# Patient Record
Sex: Female | Born: 1935 | Race: White | Hispanic: No | Smoking: Never smoker
Health system: Southern US, Community
[De-identification: ages and names within clinical notes are randomized; demographics above are authoritative.]

## PROBLEM LIST (undated history)

## (undated) DIAGNOSIS — I10 Essential (primary) hypertension: Secondary | ICD-10-CM

---

## 2004-10-09 ENCOUNTER — Ambulatory Visit: Payer: Self-pay | Admitting: Internal Medicine

## 2005-09-03 ENCOUNTER — Ambulatory Visit: Payer: Self-pay

## 2005-12-06 ENCOUNTER — Ambulatory Visit: Payer: Self-pay | Admitting: Internal Medicine

## 2006-12-09 ENCOUNTER — Ambulatory Visit: Payer: Self-pay | Admitting: Internal Medicine

## 2008-01-01 ENCOUNTER — Ambulatory Visit: Payer: Self-pay | Admitting: Internal Medicine

## 2008-07-23 ENCOUNTER — Other Ambulatory Visit: Payer: Self-pay

## 2008-07-23 ENCOUNTER — Inpatient Hospital Stay: Payer: Self-pay | Admitting: Unknown Physician Specialty

## 2008-07-28 ENCOUNTER — Encounter: Payer: Self-pay | Admitting: Internal Medicine

## 2008-07-29 ENCOUNTER — Encounter: Payer: Self-pay | Admitting: Internal Medicine

## 2008-11-18 ENCOUNTER — Ambulatory Visit: Payer: Self-pay | Admitting: Internal Medicine

## 2009-01-04 ENCOUNTER — Ambulatory Visit: Payer: Self-pay | Admitting: Internal Medicine

## 2010-01-05 ENCOUNTER — Ambulatory Visit: Payer: Self-pay | Admitting: Internal Medicine

## 2010-01-23 IMAGING — CR RIGHT HIP - COMPLETE 2+ VIEW
1 series · 2 of 2 positions shown · non-contrast
Comparison: none

REASON FOR EXAM: s/p fall with hip pain
COMMENTS:

PROCEDURE:     DXR - DXR HIP RIGHT COMPLETE  - July 23, 2008  [DATE]
RESULT:     Two views of the hip were obtained. There is a  minimally
displaced impaction type subcapital fracture of the RIGHT femoral neck. No
other fractures are seen.

[Series 1: view not recorded · 0.17mm/px · 2 of 2 slices shown]
[im 1/2]
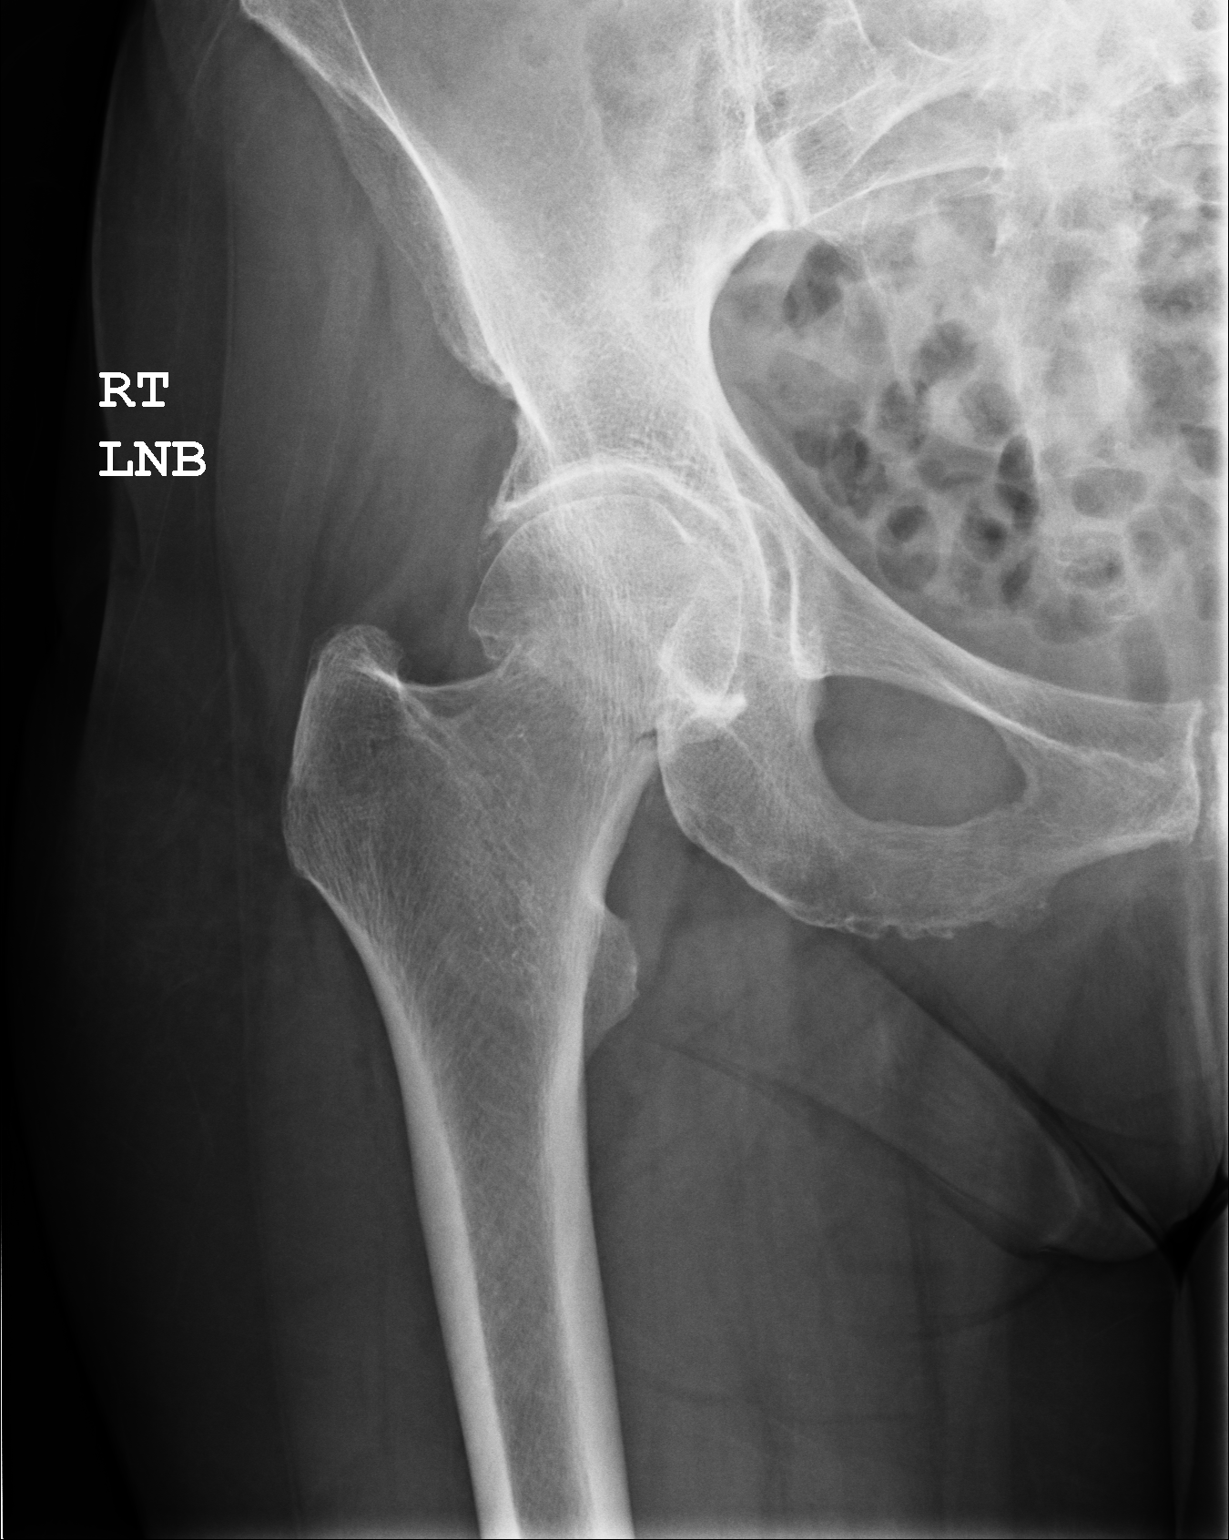
[im 2/2]
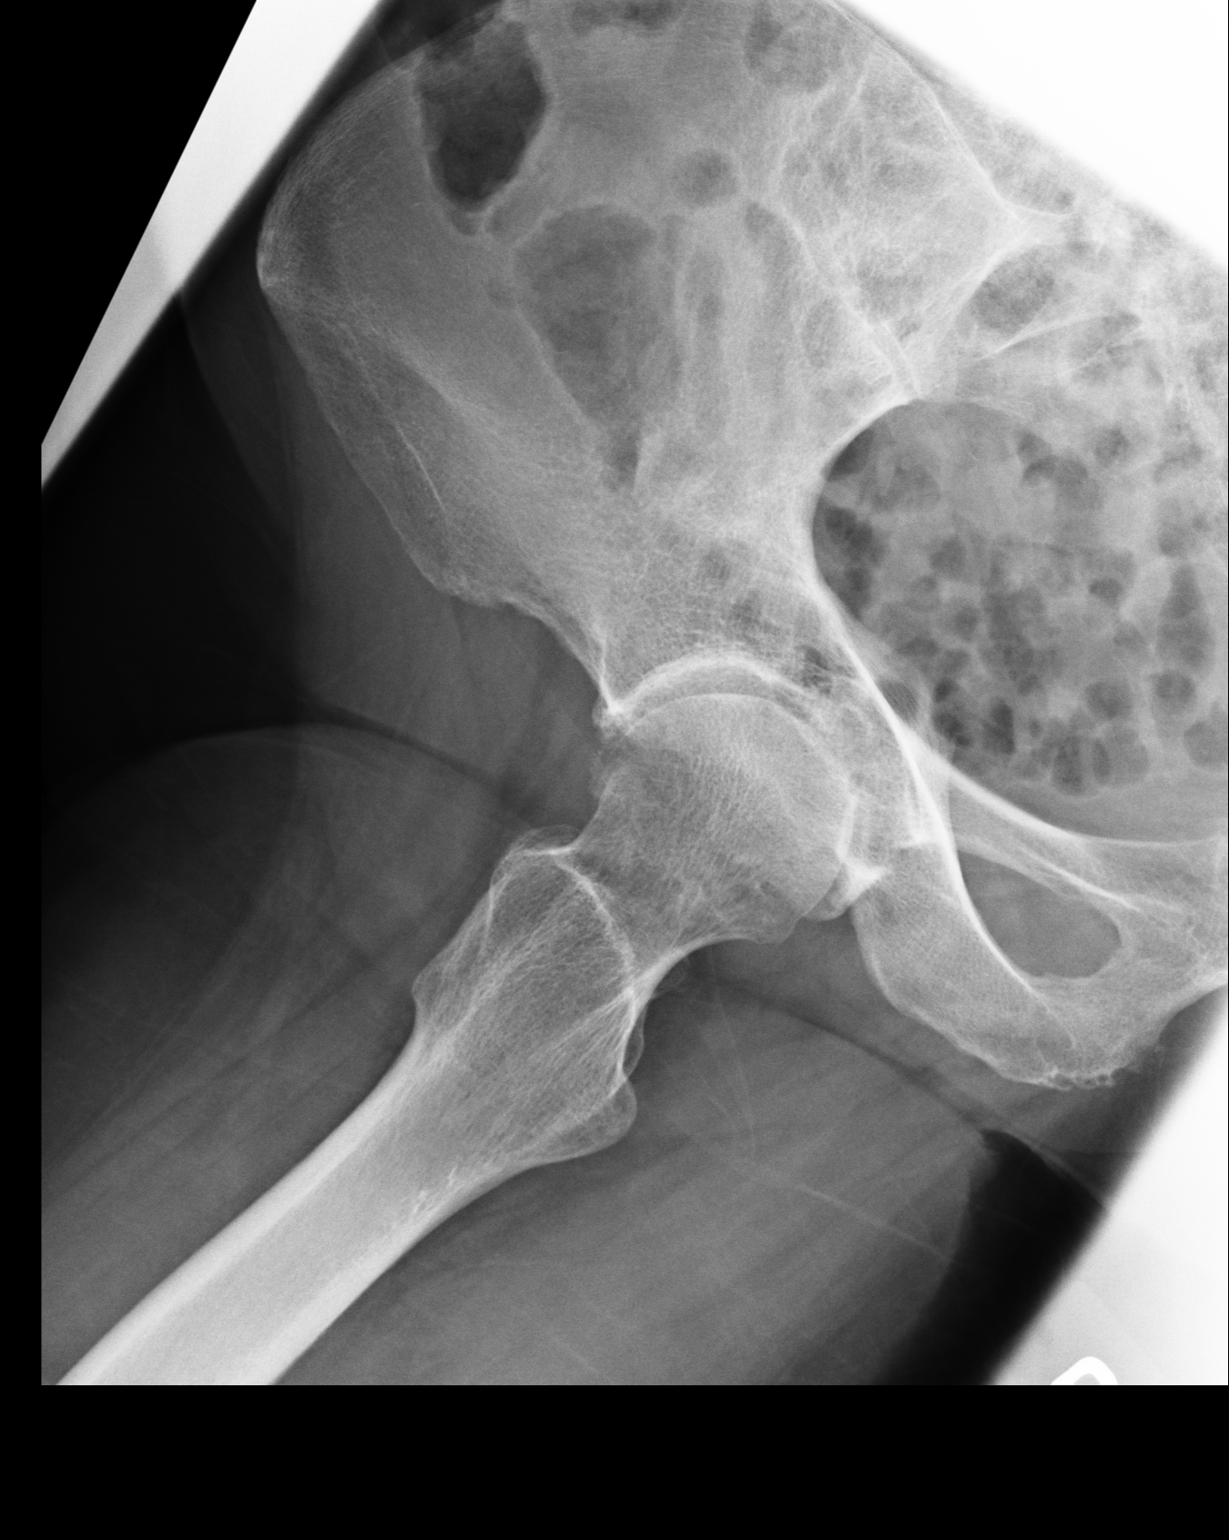

[2 of 2 positions shown; findings below may reference images not displayed]

IMPRESSION: Subcapital fracture of the RIGHT femoral neck, essentially
nondisplaced.

## 2010-01-23 IMAGING — CR DG CHEST 1V PORT
1 series · 1 of 1 positions shown · non-contrast
Comparison: none

REASON FOR EXAM: Pre-op
COMMENTS:

[view not recorded]
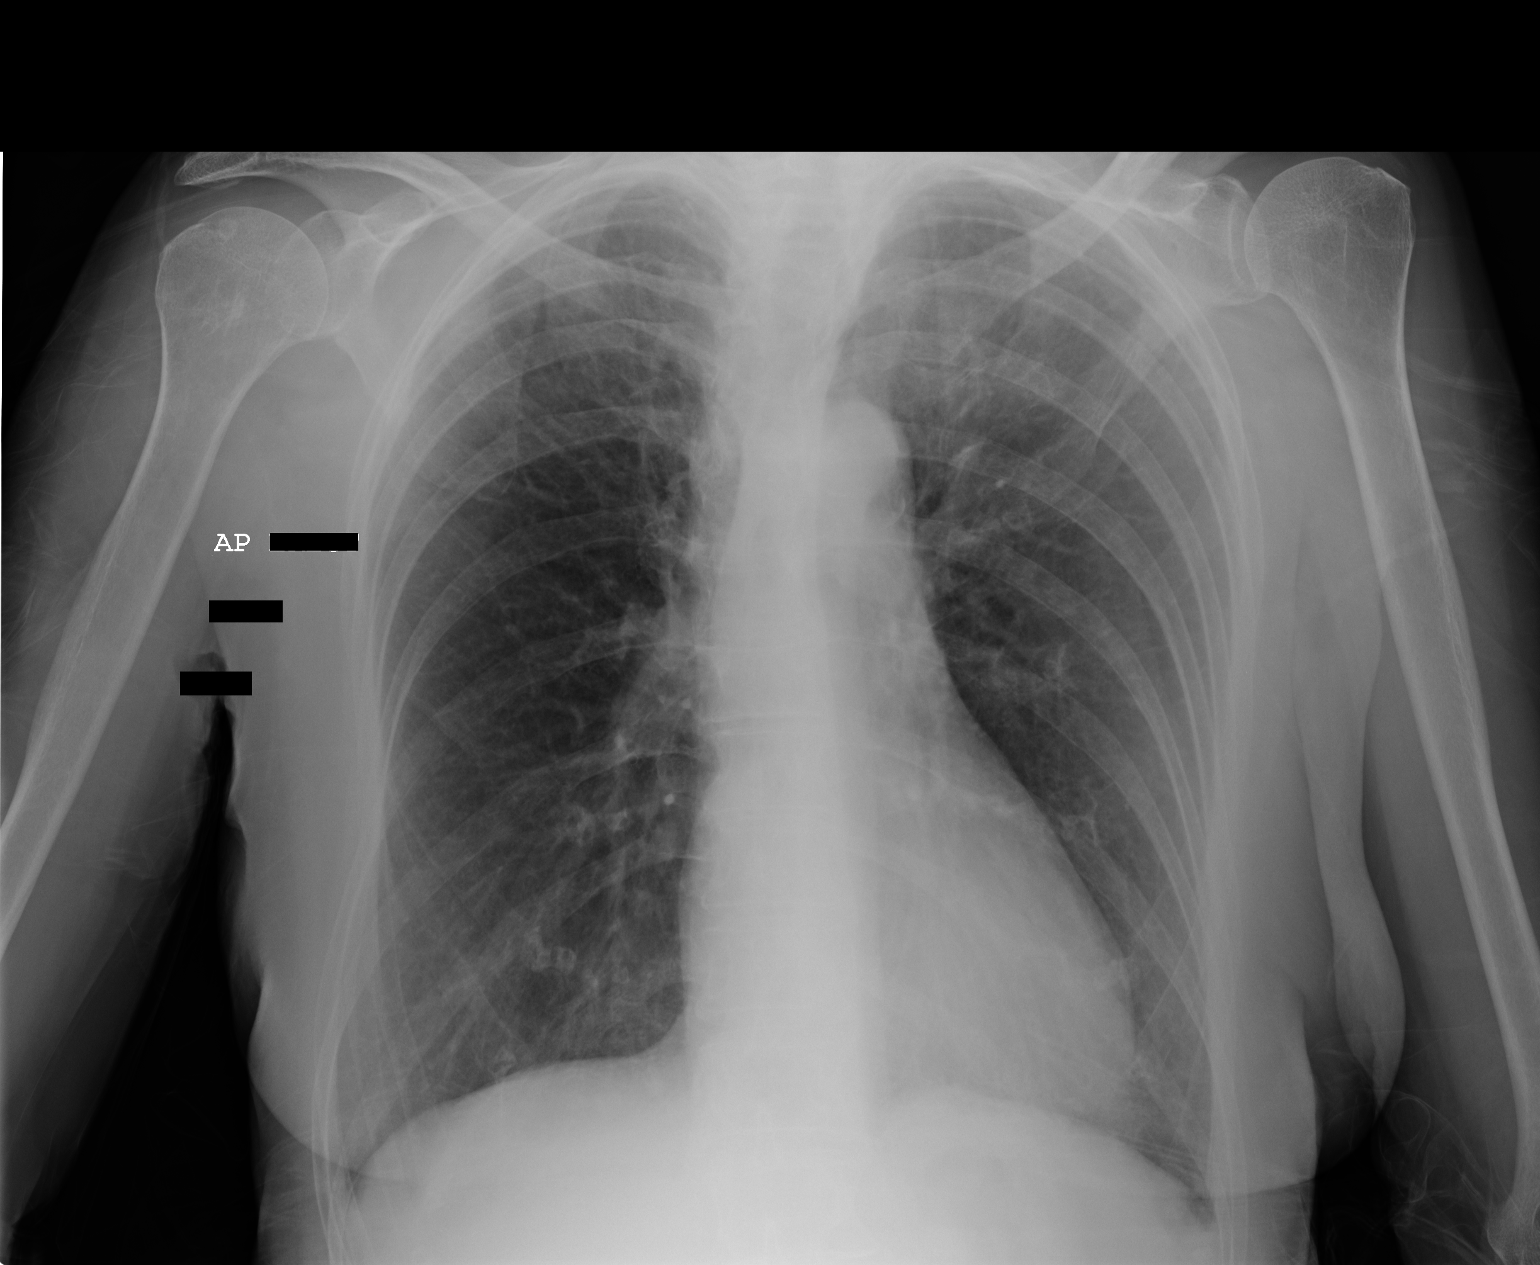

[1 of 1 positions shown; findings below may reference images not displayed]

PROCEDURE:     DXR - DXR PORTABLE CHEST SINGLE VIEW  - July 23, 2008  [DATE]

RESULT:     Frontal view of the chest is performed.

The lungs are hyperinflated. There is thickening of the interstitial
markings without evidence of peribronchial cuffing. No focal regions of
consolidation are appreciated or focal infiltrates. The cardiac silhouette
and visualized bony skeleton are unremarkable.
IMPRESSION: 1.     COPD with likely element of pulmonary fibrosis.
2.     No focal regions of consolidation are appreciated.

## 2010-01-25 IMAGING — CR RIGHT HAND - COMPLETE 3+ VIEW
1 series · 3 of 3 positions shown · non-contrast
Comparison: none

REASON FOR EXAM: Bruising and swelling of right middle digit, middle
finger
COMMENTS:

[Series 1: view not recorded · 0.17mm/px · 3 of 3 slices shown]
[im 1/3]
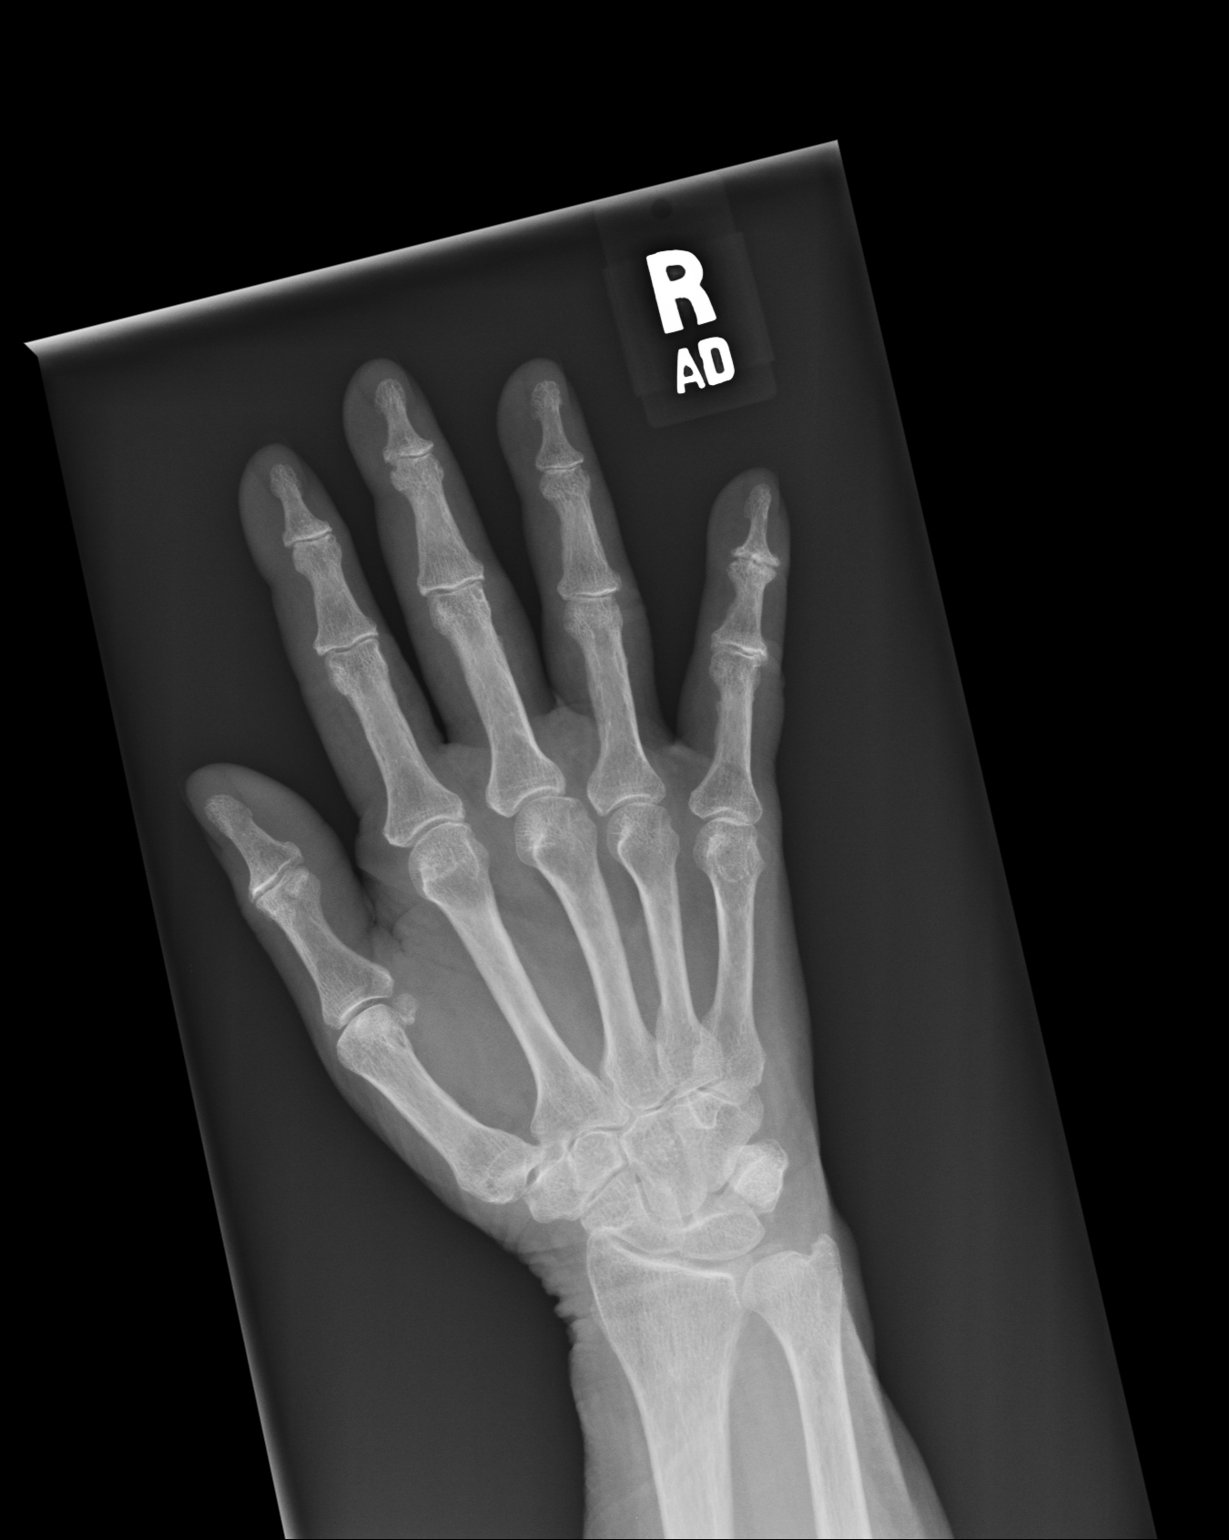
[im 2/3]
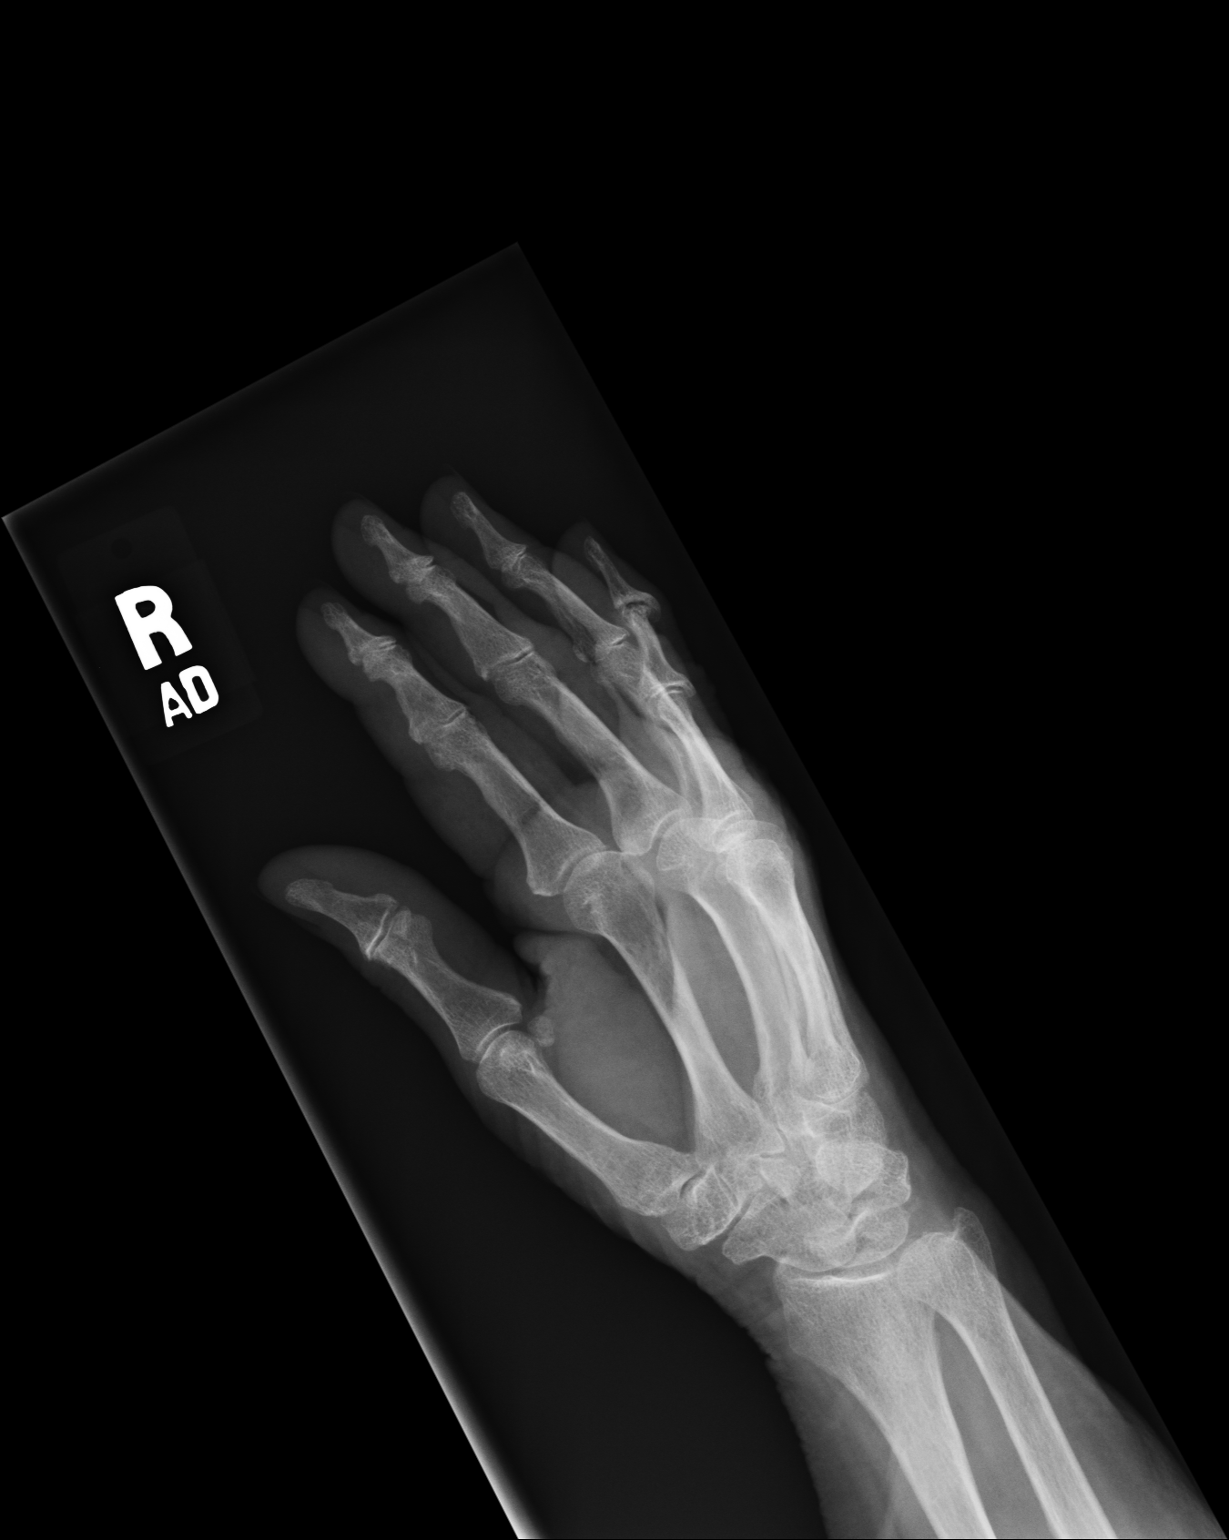
[im 3/3]
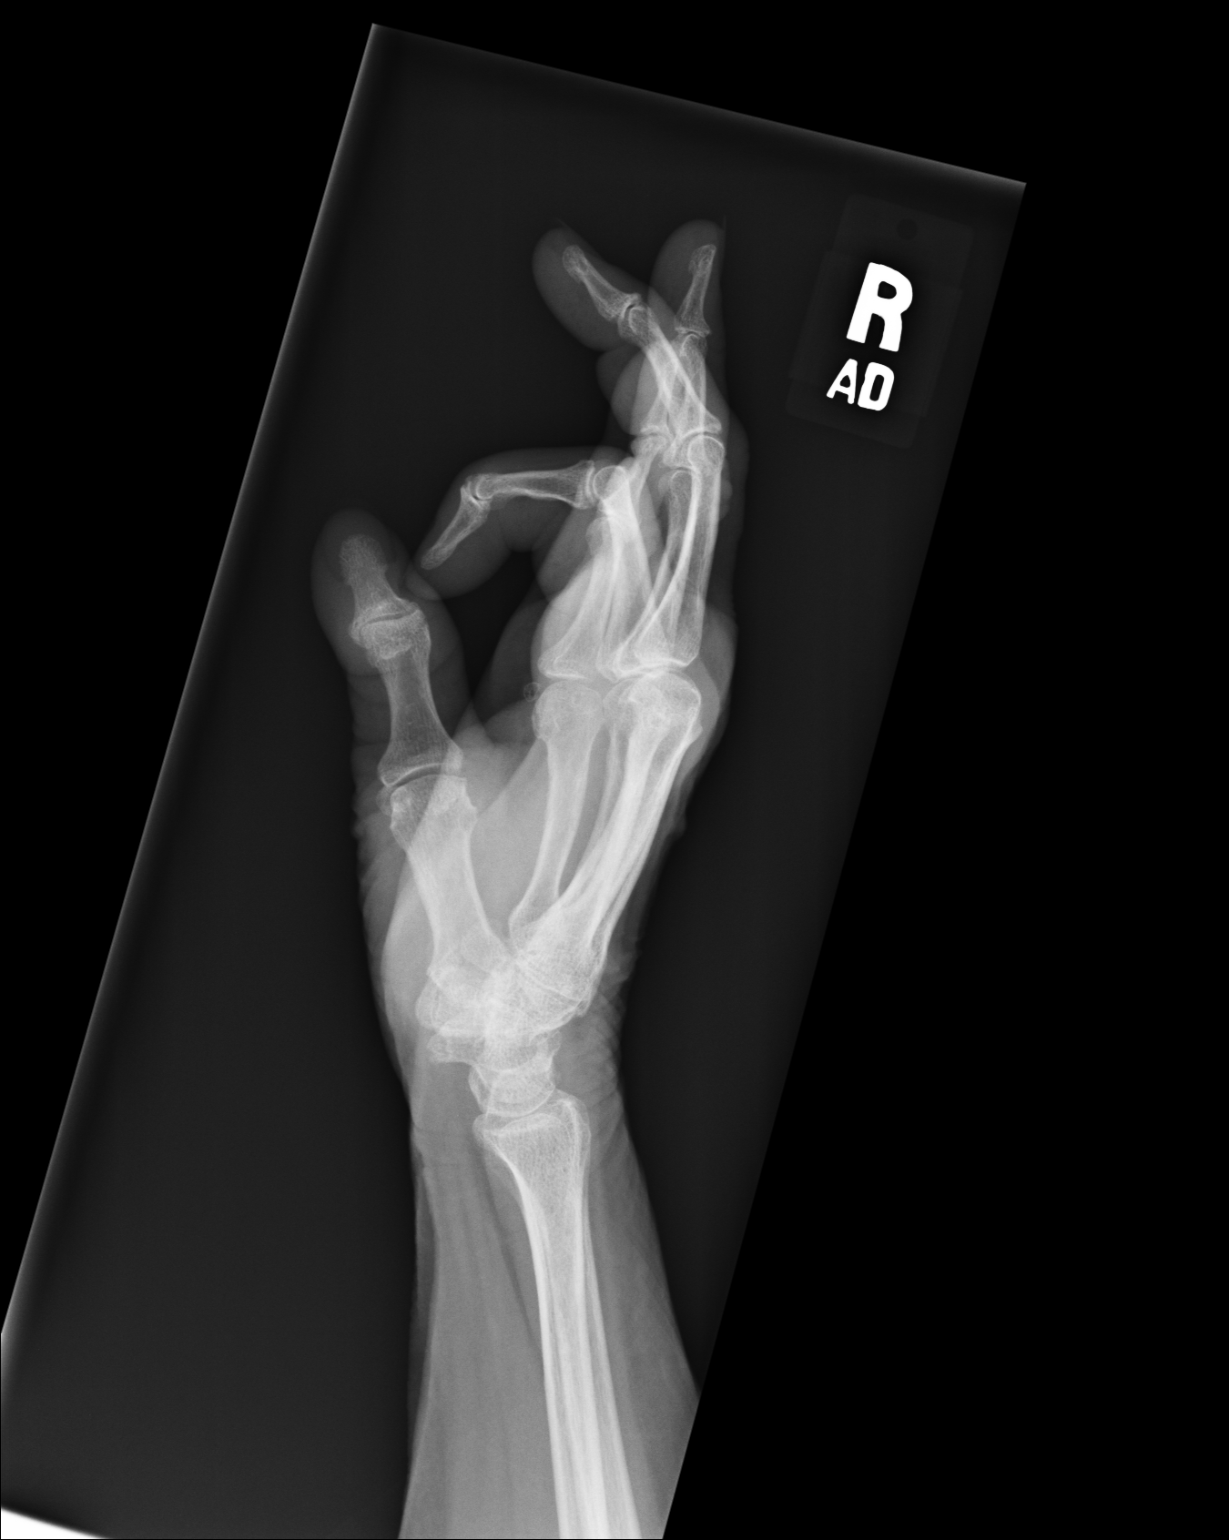

[3 of 3 positions shown; findings below may reference images not displayed]

PROCEDURE:     DXR - DXR HAND RT COMPLETE W/OBLIQUES  - July 25, 2008  [DATE]

RESULT:     There does not appear to be evidence of acute fracture or
dislocation. Degenerative changes are appreciated involving the proximal and
distal interphalangeal joints evident by subchondral sclerosis and
osteophytosis.
IMPRESSION: Degenerative changes without evidence of acute osseous
abnormalities.

## 2011-01-08 ENCOUNTER — Ambulatory Visit: Payer: Self-pay | Admitting: Internal Medicine

## 2015-12-31 ENCOUNTER — Emergency Department
Admission: EM | Admit: 2015-12-31 | Discharge: 2015-12-31 | Disposition: A | Discharge status: Home / Self Care | Payer: Medicare Other | Attending: Emergency Medicine | Admitting: Emergency Medicine

## 2015-12-31 DIAGNOSIS — I10 Essential (primary) hypertension: Secondary | ICD-10-CM | POA: Diagnosis present

## 2015-12-31 HISTORY — DX: Essential (primary) hypertension: I10

## 2015-12-31 LAB — CBC
HEMATOCRIT: 37.4 % (ref 35.0–47.0)
Hemoglobin: 12.4 g/dL (ref 12.0–16.0)
MCH: 33.2 pg (ref 26.0–34.0)
MCHC: 33.2 g/dL (ref 32.0–36.0)
MCV: 99.9 fL (ref 80.0–100.0)
Platelets: 170 10*3/uL (ref 150–440)
RBC: 3.74 MIL/uL — AB (ref 3.80–5.20)
RDW: 12.9 % (ref 11.5–14.5)
WBC: 5.6 10*3/uL (ref 3.6–11.0)

## 2015-12-31 LAB — BASIC METABOLIC PANEL
ANION GAP: 6 (ref 5–15)
BUN: 30 mg/dL — ABNORMAL HIGH (ref 6–20)
CO2: 29 mmol/L (ref 22–32)
Calcium: 10 mg/dL (ref 8.9–10.3)
Chloride: 102 mmol/L (ref 101–111)
Creatinine, Ser: 1.21 mg/dL — ABNORMAL HIGH (ref 0.44–1.00)
GFR calc Af Amer: 44 mL/min — ABNORMAL LOW (ref >60)
GFR, EST NON AFRICAN AMERICAN: 38 mL/min — AB (ref >60)
GLUCOSE: 108 mg/dL — AB (ref 65–99)
Potassium: 5.1 mmol/L (ref 3.5–5.1)
Sodium: 137 mmol/L (ref 135–145)

## 2015-12-31 LAB — TROPONIN I: Troponin I: <0.03 ng/mL (ref <0.031)

## 2015-12-31 MED ORDER — CLONIDINE HCL 0.1 MG PO TABS
0.1000 mg | ORAL_TABLET | Freq: Once | ORAL | Status: AC
  Administered 2015-12-31: 0.1 mg via ORAL
  Filled 2015-12-31: qty 1

## 2015-12-31 NOTE — ED Notes (Signed)
MD Paduchowski at bedside  

## 2015-12-31 NOTE — ED Provider Notes (Signed)
[facility] Emergency Department Provider Note  Time seen: 3:47 PM  I have reviewed the triage vital signs and the nursing notes.   HISTORY  Chief Complaint Hypertension    HPI Laura Bailey is a 82 y.o. female with a past medical history of hypertension presents the emergency department with hypertension. According to the patient's daughter with whom the patient lives, her husband was concerned that he may have taken too much of his blood pressure medication today so they checked his blood pressure. She states since she had the blood pressure cuff out she went ahead and checked the patient's blood pressure as well even though the patient was asymptomatic with no complaints. She noted a blood pressure 180 systolic so she brought the patient to the emergency department where they obtained a blood pressure 213 systolic. Patient denies any chest pain, trouble breathing, abdominal pain, nausea, vomiting, diarrhea. The patient denies any complaints at all. Patient states she believes she took her blood pressure medication this morning. The patient's daughter does state the patient has mild dementia.     Past Medical History  Diagnosis Date  . Hypertension     There are no active problems to display for this patient.   History reviewed. No pertinent past surgical history.  No current outpatient prescriptions on file.  Allergies Sulfa antibiotics  No family history on file.  Social History Social History  Substance Use Topics  . Smoking status: Never Smoker   . Smokeless tobacco: None  . Alcohol Use: No    Review of Systems Constitutional: Negative for fever Cardiovascular: Negative for chest pain. Respiratory: Negative for shortness of breath. Gastrointestinal: Negative for abdominal pain, vomiting and diarrhea. Musculoskeletal: Negative for back pain. Neurological: Negative for headache 10-point ROS otherwise  negative.  ____________________________________________   PHYSICAL EXAM:  VITAL SIGNS: ED Triage Vitals  Enc Vitals Group     BP 12/31/15 1313 213/62 mmHg     Pulse Rate 12/31/15 1313 61     Resp 12/31/15 1307 16     Temp 12/31/15 1313 97.9 F (36.6 C)     Temp Source 12/31/15 1307 Oral     SpO2 12/31/15 1313 100 %     Weight 12/31/15 1307 110 lb (49.896 kg)     Height 12/31/15 1307  (1.626 m)     Head Cir --      Peak Flow --      Pain Score --      Pain Loc --      Pain Edu? --      Excl. in GC? --     Constitutional: Alert and oriented. Well appearing and in no distress. Eyes: Normal exam ENT   Head: Normocephalic and atraumatic.   Mouth/Throat: Mucous membranes are moist. Cardiovascular: Normal rate, regular rhythm. No murmur Respiratory: Normal respiratory effort without tachypnea nor retractions. Breath sounds are clear Gastrointestinal: Soft and nontender. No distention. Musculoskeletal: Nontender with normal range of motion in all extremities. Equal 2+ radial pulses bilaterally. Neurologic:  Normal speech and language. No gross focal neurologic deficits. Ambulates without difficulty Skin:  Skin is warm, dry and intact.  Psychiatric: Mood and affect are normal. Speech and behavior are normal.  ____________________________________________    EKG  EKG reviewed and interpreted by myself shows normal sinus rhythm at 62 bpm, narrow QRS, normal axis, normal intervals, nonspecific but no concerning ST changes.  ____________________________________________     INITIAL IMPRESSION / ASSESSMENT AND PLAN / ED COURSE  Pertinent labs & imaging results that were available during my care of the patient were reviewed by me and considered in my medical decision making (see chart for details).  Patient presents the emergency department asymptomatic hypertension. Initial blood pressure 213/62. Patient's labs including troponin are within normal limits. EKG shows  no acute findings. Patient denies any symptoms at this time. We will repeat blood pressure in the emergency department.  Repeat blood pressure in the emergency department 234/81. Patient given 0.1 mg clonidine orally. Blood pressure currently 160 systolic after 0.1 mg clonidine. We will discharge patient home with PCP follow-up on Monday.    ____________________________________________   FINAL CLINICAL IMPRESSION(S) / ED DIAGNOSES  hypertension   Minna AntisKevin Chay Mazzoni, MD 12/31/15 1702

## 2015-12-31 NOTE — ED Notes (Signed)
Pt to Ed with daughter whom she lives with. Pt daughter states that pt BP was high last night, 170/74. PT did not have any sx prior to taking, family was taking BP at home and decided to take her. Pt takes Losartan for BP, has been taking for approx 3 years. Pt alert and oriented X4, active, cooperative, pt in NAD. RR even and unlabored, color WNL.

## 2015-12-31 NOTE — ED Notes (Signed)
Pt will be d/c once d/c papers are printed.  

## 2015-12-31 NOTE — Discharge Instructions (Signed)
Hypertension Hypertension, commonly called high blood pressure, is when the force of blood pumping through your arteries is too strong. Your arteries are the blood vessels that carry blood from your heart throughout your body. A blood pressure reading consists of a higher number over a lower number, such as 110/72. The higher number (systolic) is the pressure inside your arteries when your heart pumps. The lower number (diastolic) is the pressure inside your arteries when your heart relaxes. Ideally you want your blood pressure below 120/80. Hypertension forces your heart to work harder to pump blood. Your arteries may become narrow or stiff. Having untreated or uncontrolled hypertension can cause heart attack, stroke, kidney disease, and other problems. RISK FACTORS Some risk factors for high blood pressure are controllable. Others are not.  Risk factors you cannot control include:   Race. You may be at higher risk if you are African American.  Age. Risk increases with age.  Gender. Men are at higher risk than women before age 45 years. After age 65, women are at higher risk than men. Risk factors you can control include:  Not getting enough exercise or physical activity.  Being overweight.  Getting too much fat, sugar, calories, or salt in your diet.  Drinking too much alcohol. SIGNS AND SYMPTOMS Hypertension does not usually cause signs or symptoms. Extremely high blood pressure (hypertensive crisis) may cause headache, anxiety, shortness of breath, and nosebleed. DIAGNOSIS To check if you have hypertension, your health care provider will measure your blood pressure while you are seated, with your arm held at the level of your heart. It should be measured at least twice using the same arm. Certain conditions can cause a difference in blood pressure between your right and left arms. A blood pressure reading that is higher than normal on one occasion does not mean that you need treatment. If  it is not clear whether you have high blood pressure, you may be asked to return on a different day to have your blood pressure checked again. Or, you may be asked to monitor your blood pressure at home for 1 or more weeks. TREATMENT Treating high blood pressure includes making lifestyle changes and possibly taking medicine. Living a healthy lifestyle can help lower high blood pressure. You may need to change some of your habits. Lifestyle changes may include:  Following the DASH diet. This diet is high in fruits, vegetables, and whole grains. It is low in salt, red meat, and added sugars.  Keep your sodium intake below 2,300 mg per day.  Getting at least 30-45 minutes of aerobic exercise at least 4 times per week.  Losing weight if necessary.  Not smoking.  Limiting alcoholic beverages.  Learning ways to reduce stress. Your health care provider may prescribe medicine if lifestyle changes are not enough to get your blood pressure under control, and if one of the following is true:  You are 18-59 years of age and your systolic blood pressure is above 140.  You are 60 years of age or older, and your systolic blood pressure is above 150.  Your diastolic blood pressure is above 90.  You have diabetes, and your systolic blood pressure is over 140 or your diastolic blood pressure is over 90.  You have kidney disease and your blood pressure is above 140/90.  You have heart disease and your blood pressure is above 140/90. Your personal target blood pressure may vary depending on your medical conditions, your age, and other factors. HOME CARE INSTRUCTIONS    Have your blood pressure rechecked as directed by your health care provider.   Take medicines only as directed by your health care provider. Follow the directions carefully. Blood pressure medicines must be taken as prescribed. The medicine does not work as well when you skip doses. Skipping doses also puts you at risk for  problems.  Do not smoke.   Monitor your blood pressure at home as directed by your health care provider. SEEK MEDICAL CARE IF:   You think you are having a reaction to medicines taken.  You have recurrent headaches or feel dizzy.  You have swelling in your ankles.  You have trouble with your vision. SEEK IMMEDIATE MEDICAL CARE IF:  You develop a severe headache or confusion.  You have unusual weakness, numbness, or feel faint.  You have severe chest or abdominal pain.  You vomit repeatedly.  You have trouble breathing. MAKE SURE YOU:   Understand these instructions.  Will watch your condition.  Will get help right away if you are not doing well or get worse.   This information is not intended to replace advice given to you by your health care provider. Make sure you discuss any questions you have with your health care provider.   Document Released: 10/15/2005 Document Revised: 03/01/2015 Document Reviewed: 08/07/2013 Elsevier Interactive Patient Education 2016 [employer]  

## 2016-05-25 ENCOUNTER — Emergency Department: Payer: Medicare Other

## 2016-05-25 ENCOUNTER — Emergency Department
Admission: EM | Admit: 2016-05-25 | Discharge: 2016-05-25 | Disposition: A | Discharge status: Home / Self Care | Payer: Medicare Other | Attending: Emergency Medicine | Admitting: Emergency Medicine

## 2016-05-25 DIAGNOSIS — I10 Essential (primary) hypertension: Secondary | ICD-10-CM | POA: Insufficient documentation

## 2016-05-25 DIAGNOSIS — R42 Dizziness and giddiness: Secondary | ICD-10-CM | POA: Diagnosis not present

## 2016-05-25 LAB — COMPREHENSIVE METABOLIC PANEL
ALBUMIN: 4 g/dL (ref 3.5–5.0)
ALK PHOS: 50 U/L (ref 38–126)
ALT: 11 U/L — AB (ref 14–54)
AST: 23 U/L (ref 15–41)
Anion gap: 7 (ref 5–15)
BUN: 32 mg/dL — ABNORMAL HIGH (ref 6–20)
CALCIUM: 9.7 mg/dL (ref 8.9–10.3)
CHLORIDE: 102 mmol/L (ref 101–111)
CO2: 26 mmol/L (ref 22–32)
CREATININE: 1.18 mg/dL — AB (ref 0.44–1.00)
GFR calc Af Amer: 45 mL/min — ABNORMAL LOW (ref >60)
GFR calc non Af Amer: 39 mL/min — ABNORMAL LOW (ref >60)
GLUCOSE: 120 mg/dL — AB (ref 65–99)
Potassium: 3.9 mmol/L (ref 3.5–5.1)
SODIUM: 135 mmol/L (ref 135–145)
Total Bilirubin: 0.7 mg/dL (ref 0.3–1.2)
Total Protein: 7 g/dL (ref 6.5–8.1)

## 2016-05-25 LAB — CBC WITH DIFFERENTIAL/PLATELET
BASOS ABS: 0.1 10*3/uL (ref 0–0.1)
BASOS PCT: 1 %
EOS ABS: 0.1 10*3/uL (ref 0–0.7)
Eosinophils Relative: 1 %
HCT: 34.6 % — ABNORMAL LOW (ref 35.0–47.0)
HEMOGLOBIN: 11.9 g/dL — AB (ref 12.0–16.0)
Lymphocytes Relative: 29 %
Lymphs Abs: 3 10*3/uL (ref 1.0–3.6)
MCH: 33.2 pg (ref 26.0–34.0)
MCHC: 34.3 g/dL (ref 32.0–36.0)
MCV: 96.9 fL (ref 80.0–100.0)
Monocytes Absolute: 1 10*3/uL — ABNORMAL HIGH (ref 0.2–0.9)
Monocytes Relative: 10 %
NEUTROS PCT: 59 %
Neutro Abs: 6.3 10*3/uL (ref 1.4–6.5)
Platelets: 151 10*3/uL (ref 150–440)
RBC: 3.57 MIL/uL — AB (ref 3.80–5.20)
RDW: 13.1 % (ref 11.5–14.5)
WBC: 10.5 10*3/uL (ref 3.6–11.0)

## 2016-05-25 LAB — URINALYSIS COMPLETE WITH MICROSCOPIC (ARMC ONLY)
BACTERIA UA: NONE SEEN
BILIRUBIN URINE: NEGATIVE
GLUCOSE, UA: NEGATIVE mg/dL
Ketones, ur: NEGATIVE mg/dL
Leukocytes, UA: NEGATIVE
NITRITE: NEGATIVE
Protein, ur: NEGATIVE mg/dL
SQUAMOUS EPITHELIAL / LPF: NONE SEEN
Specific Gravity, Urine: 1.013 (ref 1.005–1.030)
pH: 5 (ref 5.0–8.0)

## 2016-05-25 LAB — LIPASE, BLOOD: Lipase: 44 U/L (ref 11–51)

## 2016-05-25 LAB — TROPONIN I: Troponin I: <0.03 ng/mL (ref <0.03)

## 2016-05-25 MED ORDER — MECLIZINE HCL 12.5 MG PO TABS: 12.5000 mg | ORAL_TABLET | Freq: Three times a day (TID) | ORAL | 0 refills | Status: AC | PRN

## 2016-05-25 MED ORDER — MECLIZINE HCL 25 MG PO TABS
12.5000 mg | ORAL_TABLET | Freq: Once | ORAL | Status: AC
  Administered 2016-05-25: 12.5 mg via ORAL
  Filled 2016-05-25: qty 1

## 2016-05-25 MED ORDER — SODIUM CHLORIDE 0.9 % IV BOLUS (SEPSIS)
500.0000 mL | Freq: Once | INTRAVENOUS | Status: AC
  Administered 2016-05-25: 500 mL via INTRAVENOUS

## 2016-05-25 NOTE — ED Notes (Signed)
Headache/ dizziness since fall on Sunday

## 2016-05-25 NOTE — ED Notes (Signed)
MD Yao at bedside 

## 2016-05-25 NOTE — ED Notes (Signed)
Reviewed d/c instructions, follow-up care, and prescription with pt. Pt verbalized understanding 

## 2016-05-25 NOTE — ED Notes (Signed)
Patient transported to MRI 

## 2016-05-25 NOTE — ED Provider Notes (Signed)
[facility]-EMERGENCY DEPARTMENT Provider Note   CSN: [id number] Arrival date & time: 05/25/16  2248  First Provider Contact:  First MD Initiated Contact with Patient 05/25/16 1120        History   Chief Complaint Chief Complaint  Patient presents with  . Dizziness    HPI Laura Bailey is a 82 y.o. female.  The history is provided by the patient and a relative.  Laura Bailey is a 82 y.o. female hx of HTN, Present with dizziness. Dizziness for the last week or so. Like the room is spinning. She did have some trouble walking and did hit her head several days ago.  Since then she has persistent dizziness. She states that the room occasionally and spinning. He has some headaches yesterday and have 1 episode of vomiting yesterday. Denies any chest pain or shortness of breath.    Past Medical History:  Diagnosis Date  . Hypertension     There are no active problems to display for this patient.   No past surgical history on file.  OB History    No data available       Home Medications    Prior to Admission medications   Not on File    Family History No family history on file.  Social History Social History  Substance Use Topics  . Smoking status: Never Smoker  . Smokeless tobacco: Not on file  . Alcohol use No     Allergies   Sulfa antibiotics   Review of Systems Review of Systems  Neurological: Positive for dizziness.  All other systems reviewed and are negative.    Physical Exam Updated Vital Signs BP 139/66 (BP Location: Right Arm)   Pulse 65   Temp 98.5 F (36.9 C) (Oral)   Resp 18   Ht 5\' 5"  (1.651 m)   Wt 105 lb (47.6 kg)   SpO2 99%   BMI 17.47 kg/m   Physical Exam  Constitutional: She is oriented to person, place, and time.  Chronically ill   HENT:  Head: Normocephalic.  Mouth/Throat: Oropharynx is clear and moist.  Eyes: EOM are normal. Pupils are equal, round, and reactive to light.  ? Mild R sided nystagmus, no rotatory or  vertical nystagmus   Neck: Normal range of motion. Neck supple.  Cardiovascular: Normal rate and regular rhythm.   Pulmonary/Chest: Effort normal and breath sounds normal.  Abdominal: Soft. Bowel sounds are normal.  Musculoskeletal: Normal range of motion.  Neurological: She is alert and oriented to person, place, and time.  CN 2-12 intact. ? Mild dysmetria on L side. Nl strength throughout   Skin: Skin is warm.  Psychiatric: She has a normal mood and affect.  Nursing note and vitals reviewed.    ED Treatments / Results  Labs (all labs ordered are listed, but only abnormal results are displayed) Labs Reviewed  CBC WITH DIFFERENTIAL/PLATELET - Abnormal; Notable for the following:       Result Value   RBC 3.57 (*)    Hemoglobin 11.9 (*)    HCT 34.6 (*)    Monocytes Absolute 1.0 (*)    All other components within normal limits  COMPREHENSIVE METABOLIC PANEL - Abnormal; Notable for the following:    Glucose, Bld 120 (*)    BUN 32 (*)    Creatinine, Ser 1.18 (*)    ALT 11 (*)    GFR calc non Af Amer 39 (*)    GFR calc Af Amer 45 (*)    All  other components within normal limits  URINALYSIS COMPLETEWITH MICROSCOPIC ([facility] ONLY) - Abnormal; Notable for the following:    Color, Urine YELLOW (*)    APPearance CLEAR (*)    Hgb urine dipstick 2+ (*)    All other components within normal limits  LIPASE, BLOOD  TROPONIN I    EKG  EKG Interpretation None      ED ECG REPORT I, Luchiano Viscomi, the attending physician, personally viewed and interpreted this ECG.   Date: 05/25/2016  EKG Time: 9:23 am  Rate: 68  Rhythm: normal EKG, normal sinus rhythm  Axis: normal  Intervals:none  ST&T Change: nonspecific     Radiology Ct Head Wo Contrast  Result Date: 05/25/2016 CLINICAL DATA:  Pain following fall EXAM: CT HEAD WITHOUT CONTRAST CT CERVICAL SPINE WITHOUT CONTRAST TECHNIQUE: Multidetector CT imaging of the head and cervical spine was performed following the standard protocol  without intravenous contrast. Multiplanar CT image reconstructions of the cervical spine were also generated. COMPARISON:  None. FINDINGS: CT HEAD FINDINGS There is moderate diffuse atrophy. There is no intracranial mass, hemorrhage, extra-axial fluid collection, or midline shift. There is patchy small vessel disease throughout the centra semiovale bilaterally. Elsewhere gray-white compartments appear unremarkable. No acute infarct is evident. There are foci of calcification in each distal vertebral artery as well as in the carotid siphon regions bilaterally. No hyperdense vessels are evident. The bony calvarium appears intact. The mastoid air cells are clear. Paranasal sinuses are clear. There is mild leftward deviation of the nasal septum. No intraorbital lesions are evident. CT CERVICAL SPINE FINDINGS There is no demonstrable fracture or spondylolisthesis. Prevertebral soft tissues and predental space regions are normal. There is moderately severe disc space narrowing at C6-7. There is moderate disc space narrowing at C5-6. There is facet hypertrophy at multiple levels in the cervical region. There is moderate pannus surrounding the odontoid without appreciable impression on the craniocervical junction. There is carotid artery calcification bilaterally. IMPRESSION: CT head: Atrophy with periventricular small vessel disease. No intracranial mass, hemorrhage, or extra-axial fluid collection. No acute infarct. There are multiple foci of arterial vascular calcification. There is leftward deviation of the nasal septum. CT cervical spine: No fracture or spondylolisthesis. Osteoarthritic changes noted at multiple levels. There are foci of carotid artery calcification bilaterally. Electronically Signed   By: Bretta Bang III M.D.   On: 05/25/2016 10:32  Ct Cervical Spine Wo Contrast  Result Date: 05/25/2016 CLINICAL DATA:  Pain following fall EXAM: CT HEAD WITHOUT CONTRAST CT CERVICAL SPINE WITHOUT CONTRAST  TECHNIQUE: Multidetector CT imaging of the head and cervical spine was performed following the standard protocol without intravenous contrast. Multiplanar CT image reconstructions of the cervical spine were also generated. COMPARISON:  None. FINDINGS: CT HEAD FINDINGS There is moderate diffuse atrophy. There is no intracranial mass, hemorrhage, extra-axial fluid collection, or midline shift. There is patchy small vessel disease throughout the centra semiovale bilaterally. Elsewhere gray-white compartments appear unremarkable. No acute infarct is evident. There are foci of calcification in each distal vertebral artery as well as in the carotid siphon regions bilaterally. No hyperdense vessels are evident. The bony calvarium appears intact. The mastoid air cells are clear. Paranasal sinuses are clear. There is mild leftward deviation of the nasal septum. No intraorbital lesions are evident. CT CERVICAL SPINE FINDINGS There is no demonstrable fracture or spondylolisthesis. Prevertebral soft tissues and predental space regions are normal. There is moderately severe disc space narrowing at C6-7. There is moderate disc space narrowing at C5-6. There  is facet hypertrophy at multiple levels in the cervical region. There is moderate pannus surrounding the odontoid without appreciable impression on the craniocervical junction. There is carotid artery calcification bilaterally. IMPRESSION: CT head: Atrophy with periventricular small vessel disease. No intracranial mass, hemorrhage, or extra-axial fluid collection. No acute infarct. There are multiple foci of arterial vascular calcification. There is leftward deviation of the nasal septum. CT cervical spine: No fracture or spondylolisthesis. Osteoarthritic changes noted at multiple levels. There are foci of carotid artery calcification bilaterally. Electronically Signed   By: Bretta Bang III M.D.   On: 05/25/2016 10:32  Mr Brain Wo Contrast  Result Date:  05/25/2016 CLINICAL DATA:  Headache and dizziness.  Recent fall EXAM: MRI HEAD WITHOUT CONTRAST TECHNIQUE: Multiplanar, multiecho pulse sequences of the brain and surrounding structures were obtained without intravenous contrast. COMPARISON:  CT head 05/25/2016 FINDINGS: Image quality degraded by motion Moderate to advanced atrophy.  Negative for hydrocephalus Negative for acute infarct. Moderate chronic microvascular ischemic changes in the white matter. No significant ischemia in the basal ganglia or brainstem or cerebellum. Negative for hemorrhage or fluid collection.  No subdural hematoma Negative for mass or edema.  No shift of the midline structures. Circle of Willis vessels are patent. No orbital mass lesion. Bilateral cataract extraction. Paranasal sinuses clear.  Pituitary not enlarged. IMPRESSION: Atrophy and chronic microvascular ischemia No acute intracranial abnormality. Electronically Signed   By: Marlan Palau M.D.   On: 05/25/2016 15:19   Procedures Procedures (including critical care time)  Medications Ordered in ED Medications  sodium chloride 0.9 % bolus 500 mL (0 mLs Intravenous Stopped 05/25/16 1245)  meclizine (ANTIVERT) tablet 12.5 mg (12.5 mg Oral Given 05/25/16 1207)     Initial Impression / Assessment and Plan / ED Course  I have reviewed the triage vital signs and the nursing notes.  Pertinent labs & imaging results that were available during my care of the patient were reviewed by me and considered in my medical decision making (see chart for details).  Clinical Course    Talayia Hjort is a 82 y.o. female here with fall, dizziness. Consider posterior circulation stroke vs peripheral vertigo vs electrolyte abnormality vs UTI. Will get labs, CT head, MRI brain, UA.   3:48 PM MRi brain unremarkable. Not orthostatic. Labs at baseline. UA showed some blood but is a cath specimen and has no UTI. Will dc home with meclizine for peripheral vertigo.   Final Clinical  Impressions(s) / ED Diagnoses   Final diagnoses:  None    New Prescriptions New Prescriptions   No medications on file     Charlynne Pander, MD 05/25/16 1549

## 2016-05-25 NOTE — ED Notes (Signed)
Pt returned from MRI °

## 2016-05-25 NOTE — ED Triage Notes (Signed)
Daughter states that patient has been having intermittent dizziness since last Friday, states she fell Sunday due to the dizziness and hit the wall, pt has a headache after and some arm pain, daughter states that she cont to have a slight headache and vomited yesterday

## 2016-05-25 NOTE — Discharge Instructions (Signed)
Take meclizine as needed for dizziness.   Stay hydrated.   See your doctor.   Return to ER if she has worse dizziness, vomiting, headaches.

## 2018-05-30 ENCOUNTER — Other Ambulatory Visit: Payer: Self-pay | Admitting: Family Medicine

## 2018-05-30 ENCOUNTER — Ambulatory Visit
Admission: RE | Admit: 2018-05-30 | Discharge: 2018-05-30 | Disposition: A | Discharge status: Home / Self Care | Payer: Medicare Other | Source: Ambulatory Visit | Attending: Family Medicine | Admitting: Family Medicine

## 2018-05-30 DIAGNOSIS — R519 Headache, unspecified: Secondary | ICD-10-CM

## 2018-05-30 DIAGNOSIS — R51 Headache: Principal | ICD-10-CM

## 2019-02-27 DEATH — deceased
